# Patient Record
Sex: Female | Born: 1975 | Race: Black or African American | Hispanic: No | Marital: Single | Smoking: Never smoker
Health system: Southern US, Community
[De-identification: ages and names within clinical notes are randomized; demographics above are authoritative.]

---

## 2000-09-04 ENCOUNTER — Encounter: Payer: Self-pay | Admitting: Family Medicine

## 2000-09-04 ENCOUNTER — Ambulatory Visit (HOSPITAL_COMMUNITY): Admission: RE | Admit: 2000-09-04 | Discharge: 2000-09-04 | Payer: Self-pay | Admitting: Family Medicine

## 2001-01-09 ENCOUNTER — Other Ambulatory Visit: Admission: RE | Admit: 2001-01-09 | Discharge: 2001-01-09 | Payer: Self-pay | Admitting: Family Medicine

## 2002-09-16 ENCOUNTER — Other Ambulatory Visit: Admission: RE | Admit: 2002-09-16 | Discharge: 2002-09-16 | Payer: Self-pay | Admitting: Family Medicine

## 2004-03-02 ENCOUNTER — Emergency Department (HOSPITAL_COMMUNITY): Admission: EM | Admit: 2004-03-02 | Discharge: 2004-03-03 | Payer: Self-pay | Admitting: Emergency Medicine

## 2004-05-05 ENCOUNTER — Emergency Department (HOSPITAL_COMMUNITY): Admission: EM | Admit: 2004-05-05 | Discharge: 2004-05-05 | Payer: Self-pay | Admitting: Emergency Medicine

## 2004-09-30 ENCOUNTER — Emergency Department (HOSPITAL_COMMUNITY): Admission: EM | Admit: 2004-09-30 | Discharge: 2004-09-30 | Payer: Self-pay | Admitting: Emergency Medicine

## 2007-12-03 ENCOUNTER — Emergency Department (HOSPITAL_COMMUNITY): Admission: EM | Admit: 2007-12-03 | Discharge: 2007-12-03 | Payer: Self-pay | Admitting: Emergency Medicine

## 2008-01-15 ENCOUNTER — Ambulatory Visit (HOSPITAL_COMMUNITY): Admission: RE | Admit: 2008-01-15 | Discharge: 2008-01-15 | Payer: Self-pay | Admitting: Pulmonary Disease

## 2009-02-18 ENCOUNTER — Ambulatory Visit (HOSPITAL_BASED_OUTPATIENT_CLINIC_OR_DEPARTMENT_OTHER): Admission: RE | Admit: 2009-02-18 | Discharge: 2009-02-18 | Payer: Self-pay | Admitting: General Surgery

## 2009-02-18 ENCOUNTER — Encounter (INDEPENDENT_AMBULATORY_CARE_PROVIDER_SITE_OTHER): Payer: Self-pay | Admitting: General Surgery

## 2009-06-27 IMAGING — CR DG ANKLE COMPLETE 3+V*L*
2 series · 2 of 2 positions shown · non-contrast
Comparison: none

CLINICAL DATA: Lateral ankle pain.  Injury.
 LEFT ANKLE ? 3 VIEW:

[view not recorded (1 of 2)]
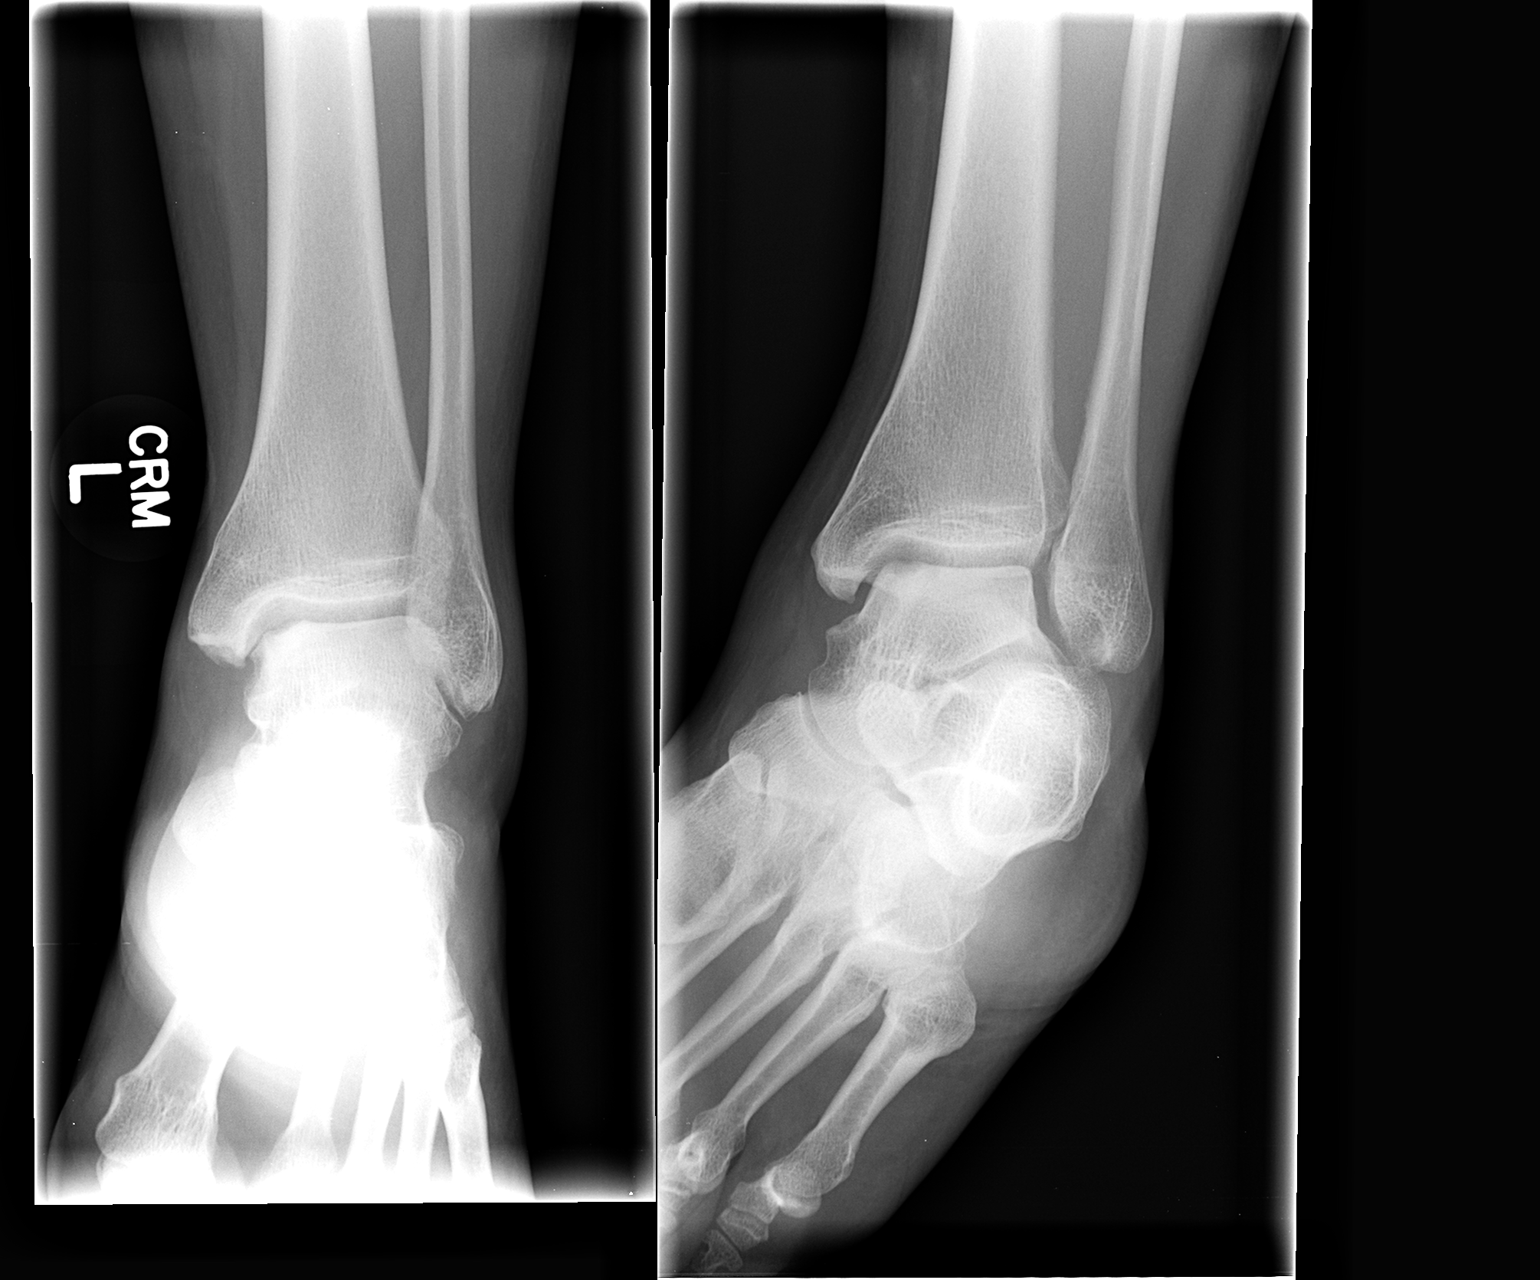

[view not recorded (2 of 2)]
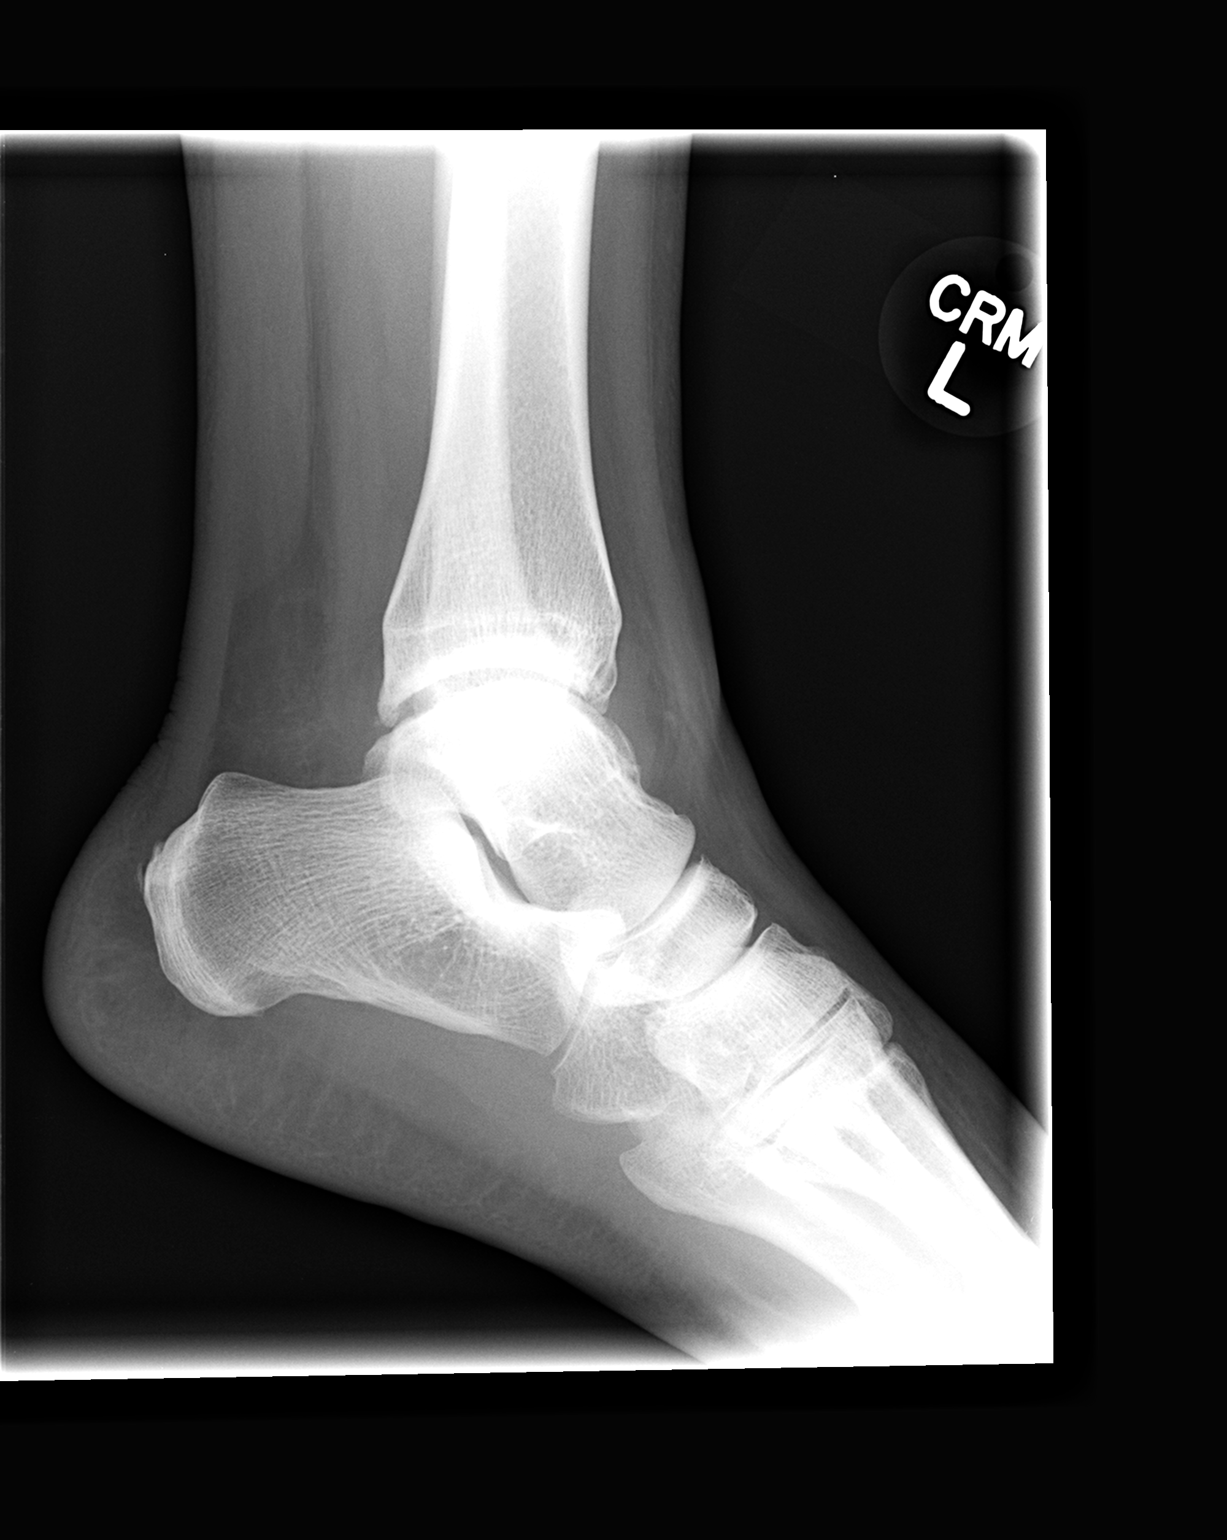

[2 of 2 positions shown; findings below may reference images not displayed]

FINDINGS: Patient has a tibiotalar joint effusion. There is a prominent lucency in the medial talar dome with cortical irregularity consistent with an osteochondral lesion.  Imaged bones are otherwise unremarkable.
IMPRESSION: 1.  Osteochondral lesion medial talar bone.
 2.  Tibiotalar joint effusion.

## 2009-08-09 IMAGING — CR DG FOOT COMPLETE 3+V*L*
3 series · 3 of 3 positions shown · non-contrast
Comparison: none

CLINICAL DATA: 32-year-old, injured foot and ankle. 
 LEFT ANKLE - 3 VIEW:

[t foot ap left]
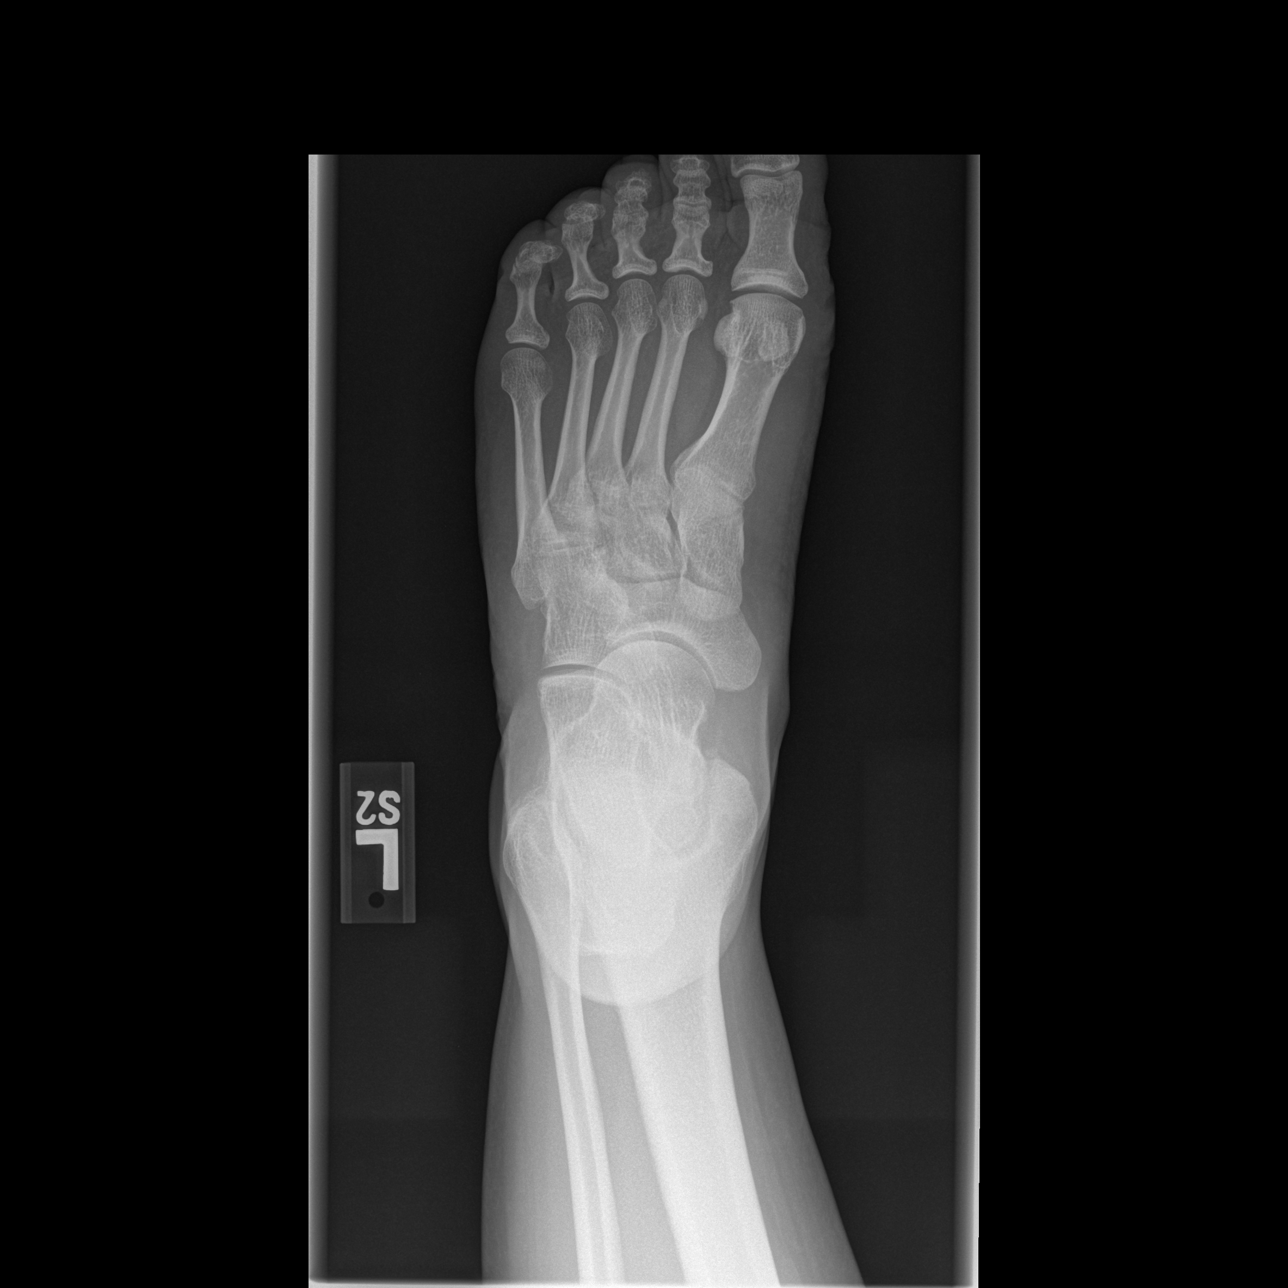

[t foot oblique left]
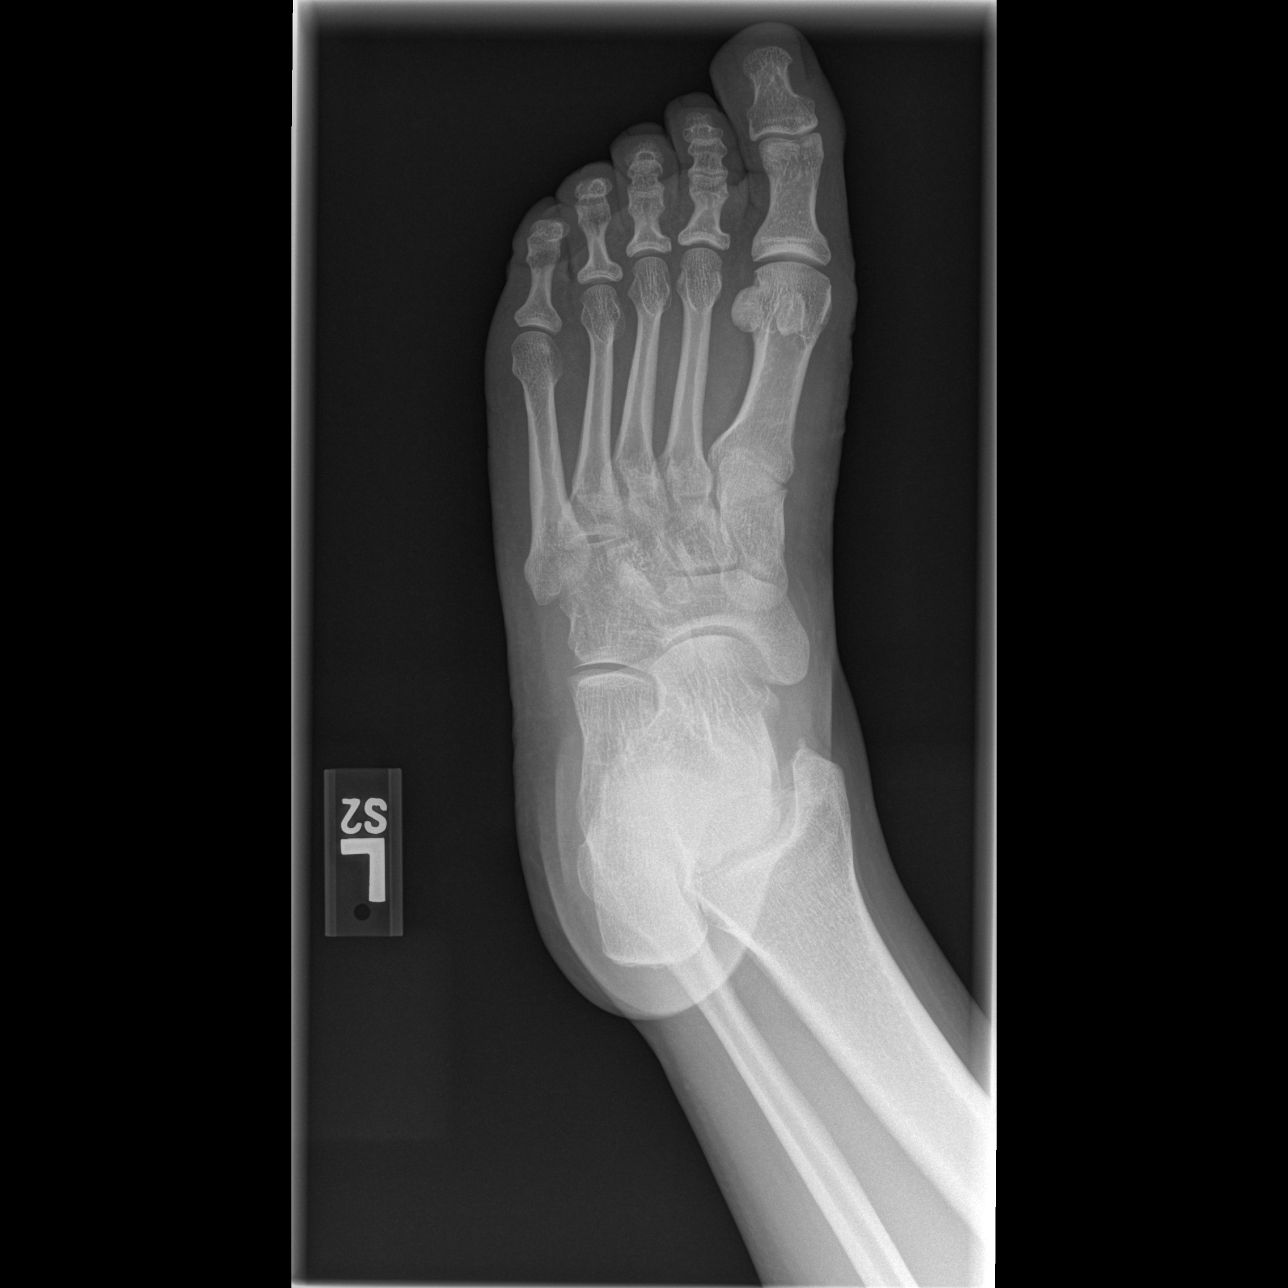

[t foot lat left]
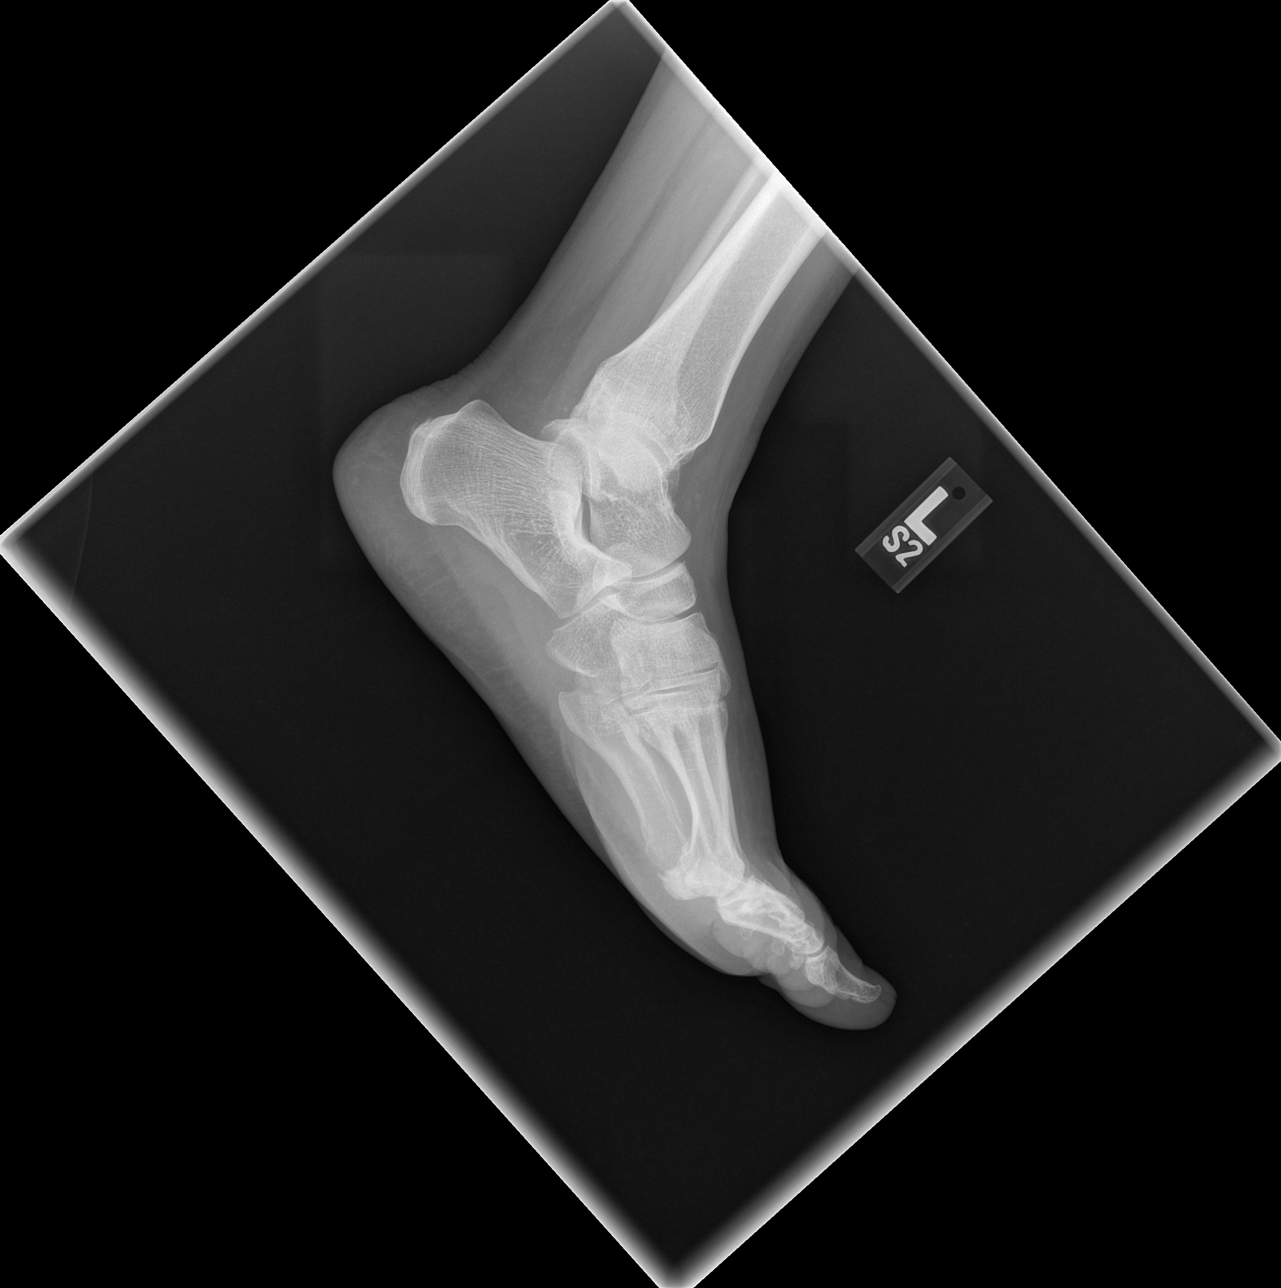

[3 of 3 positions shown; findings below may reference images not displayed]

FINDINGS: The ankle mortise is maintained.  No fractures are seen.  There is an osteochondral lesion involving the medial talar dome. MR Adonay be helpful for further evaluation of this finding.
IMPRESSION: 1.  No acute fracture. 
 2.  Osteochondral lesion involving the medial talar dome.  MR suggested for a better evaluation. 
 LEFT FOOT - 3 VIEW:
FINDINGS: Joint spaces are maintained.  No fractures are seen.
IMPRESSION: No acute bony findings.

## 2011-03-22 LAB — POCT HEMOGLOBIN-HEMACUE: Hemoglobin: 12.6 g/dL (ref 12.0–15.0)

## 2011-04-24 NOTE — Op Note (Signed)
NAMELUJEAN, EBRIGHT                  ACCOUNT NO.:  [id number]   MEDICAL RECORD NO.:  [id number]          PATIENT TYPE:  AMB   LOCATION:  DSC                          FACILITY:  [facility]   PHYSICIAN:  Cherylynn Ridges, M.D.    DATE OF BIRTH:  May 20, 1976   DATE OF PROCEDURE:  02/18/2009  DATE OF DISCHARGE:                               OPERATIVE REPORT   PREOPERATIVE DIAGNOSIS:  Lipoma of the right shoulder.   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   PROCEDURE:  Excision of right shoulder lipoma.   SURGEON:  Cherylynn Ridges, MD   ANESTHESIA:  Monitored anesthesia care with 9 mL of Marcaine and  lidocaine mixture.   COMPLICATIONS:  None.   CONDITION:  Stable.   FINDINGS:  A 4 x 5 cm subfascial lipoma in the right shoulder into the  deltoid muscle.   INDICATIONS FOR OPERATION:  The patient is a 35 year old with a growing  and more symptomatic right shoulder lipoma who comes in for excision.   OPERATION:  The patient was taken to the operating room, placed on table  in supine position.  After an adequate amount of IV sedation was given,  she was prepped and draped in usual sterile manner exposing the anterior  aspect of the right shoulder.   We marked the area of least tension on the suture line as we made our  incision along the skin lines.  We anesthetized the area with the  mixture of lidocaine and Marcaine.  Epinephrine was included.  We  injected approximately 9 mL into the area across the incision line.  Then we used a 15 blade to make the incision down into the subcutaneous  tissue.  Through the initial layer of the subcu, we got down to a smooth  capsular area of the lipoma, which we had it circumferentially dissected  out from underneath the fascia and the shoulder with interdigitating  between the fibers of the deltoid muscle.  We were able to get down to  its base and remove it in total without leaving any components inside.  Once this was done,  we irrigated with saline, then we closed in 2  layers, deep 3-0 Vicryl layer, then the skin was closed using running  subcuticular stitch of 4-0 Monocryl.  No additional anesthetic was  given.  It was closed with Dermabond, Steri-Strips, and a Tegaderm.  All  counts were correct.     Cherylynn Ridges, M.D.  Electronically Signed    JOW/MEDQ  D:  02/18/2009  T:  02/18/2009  Job:  [id number]

## 2011-09-14 LAB — URIC ACID: Uric Acid, Serum: 3.9

## 2014-01-08 ENCOUNTER — Emergency Department (INDEPENDENT_AMBULATORY_CARE_PROVIDER_SITE_OTHER)
Admission: EM | Admit: 2014-01-08 | Discharge: 2014-01-08 | Disposition: A | Discharge status: Home / Self Care | Payer: Self-pay | Source: Home / Self Care | Attending: Family Medicine | Admitting: Family Medicine

## 2014-01-08 ENCOUNTER — Encounter (HOSPITAL_COMMUNITY): Payer: Self-pay | Admitting: Emergency Medicine

## 2014-01-08 DIAGNOSIS — G510 Bell's palsy: Secondary | ICD-10-CM

## 2014-01-08 MED ORDER — ACYCLOVIR 400 MG PO TABS: 800.0000 mg | ORAL_TABLET | Freq: Three times a day (TID) | ORAL | Status: AC

## 2014-01-08 NOTE — ED Notes (Signed)
Patient c/o 2 week duration of facial numbness, right sided facial weakness . Facial sagging w smile , face issues only. Denies other issues

## 2014-01-08 NOTE — ED Provider Notes (Signed)
CSN: [id number]     Arrival date & time 01/08/14  1953 History   First MD Initiated Contact with Patient 01/08/14 2040     Chief Complaint  Patient presents with  . Numbness   (Consider location/radiation/quality/duration/timing/severity/associated sxs/prior Treatment) HPI  38 year old f with facial droop. It is right sided. 2 weeks duration. First noticed that her smile was assymmettic when taking a photo. Also, her right eye has been watering more than usual. No difficulty swallowing or speaking. No pain. No hx of similar problems. She was told it may be Bells palsy. She denies numbness or weakness of her extermities. No recent illnesses.   History reviewed. No pertinent past medical history. History reviewed. No pertinent past surgical history. History reviewed. No pertinent family history. History  Substance Use Topics  . Smoking status: Never Smoker   . Smokeless tobacco: Not on file  . Alcohol Use: No   OB History   Grav Para Term Preterm Abortions TAB SAB Ect Mult Living                 Review of Systems See HPI Allergies  Shellfish allergy  Home Medications   Current Outpatient Rx  Name  Route  Sig  Dispense  Refill  . acyclovir (ZOVIRAX) 400 MG tablet   Oral   Take 2 tablets (800 mg total) by mouth 3 (three) times daily.   42 tablet   0    BP 128/63  Pulse 72  Temp(Src) 98.4 F (36.9 C) (Oral)  Resp 14  SpO2 100%  LMP 12/15/2013 Physical Exam  Constitutional: She is oriented to person, place, and time. She appears well-developed and well-nourished. No distress.  HENT:  Head: Normocephalic.  Mouth/Throat: Oropharynx is clear and moist.  Eyes: Conjunctivae are normal. Pupils are equal, round, and reactive to light.  Cardiovascular: Normal rate, regular rhythm and normal heart sounds.   Pulmonary/Chest: Effort normal and breath sounds normal.  Neurological: She is alert and oriented to person, place, and time. She has normal reflexes. She displays normal  reflexes. A cranial nerve deficit is present. No sensory deficit. She exhibits normal muscle tone. Coordination normal.  Right sided facial nerve palsy involving the cheek predominantly and mouth with mild eye and forehead symptoms     ED Course  Procedures (including critical care time) Labs Review Labs Reviewed - No data to display Imaging Review No results found.    MDM   1. Bell's palsy    Mild lesion and already showing improvement which is a good prognosticator. Given acyclovir to take for one week to see if this aids in the recovery process.     Garnetta BuddyEdward V Derry Kassel, MD 01/08/14 2200

## 2014-01-08 NOTE — Discharge Instructions (Signed)
Hannah Dixon,   You seem to have a case of Bell's palsy, which is paralysis of the facial nerve. Thankfully you have shown improvement already. This is a good sign. Please try the acyclovir , 2 tablets 3 times a day, for one week. It should continue to get better. Please let us know if things worsen.  Sincerely,   Dr. Vena RuaWilliamson   Bell's Palsy Bell's palsy is a condition in which the muscles on one side of the face cannot move (paralysis). This is because the nerves in the face are paralyzed. It is most often thought to be caused by a virus. The virus causes swelling of the nerve that controls movement on one side of the face. The nerve travels through a tight space surrounded by bone. When the nerve swells, it can be compressed by the bone. This results in damage to the protective covering around the nerve. This damage interferes with how the nerve communicates with the muscles of the face. As a result, it can cause weakness or paralysis of the facial muscles.  Injury (trauma), tumor, and surgery may cause Bell's palsy, but most of the time the cause is unknown. It is a relatively common condition. It starts suddenly (abrupt onset) with the paralysis usually ending within 2 days. Bell's palsy is not dangerous. But because the eye does not close properly, you may need care to keep the eye from getting dry. This can include splinting (to keep the eye shut) or moistening with artificial tears. Bell's palsy very seldom occurs on both sides of the face at the same time. SYMPTOMS   Eyebrow sagging.  Drooping of the eyelid and corner of the mouth.  Inability to close one eye.  Loss of taste on the front of the tongue.  Sensitivity to loud noises. TREATMENT  The treatment is usually non-surgical. If the patient is seen within the first 24 to 48 hours, a short course of steroids may be prescribed, in an attempt to shorten the length of the condition. Antiviral medicines may also be used with the  steroids, but it is unclear if they are helpful.  You will need to protect your eye, if you cannot close it. The cornea (clear covering over your eye) will become dry and can be damaged. Artificial tears can be used to keep your eye moist. Glasses or an eye patch should be worn to protect your eye. PROGNOSIS  Recovery is variable, ranging from days to months. Although the problem usually goes away completely (about 80% of cases resolve), predicting the outcome is impossible. Most people improve within 3 weeks of when the symptoms began. Improvement may continue for 3 to 6 months. A small number of people have moderate to severe weakness that is permanent.  HOME CARE INSTRUCTIONS   If your caregiver prescribed medication to reduce swelling in the nerve, use as directed. Do not stop taking the medication unless directed by your caregiver.  Use moisturizing eye drops as needed to prevent drying of your eye, as directed by your caregiver.  Protect your eye, as directed by your caregiver.  Use facial massage and exercises, as directed by your caregiver.  Perform your normal activities, and get your normal rest. SEEK IMMEDIATE MEDICAL CARE IF:   There is pain, redness or irritation in the eye.  You or your child has an oral temperature above 102 F (38.9 C), not controlled by medicine. MAKE SURE YOU:   Understand these instructions.  Will watch your condition.  Will  get help right away if you are not doing well or get worse. Document Released: 11/26/2005 Document Revised: 02/18/2012 Document Reviewed: 12/05/2009 [facility] Patient Information 2014 [place], [place].

## 2014-01-10 NOTE — ED Provider Notes (Signed)
Medical screening examination/treatment/procedure(s) were performed by resident physician or non-physician practitioner and as supervising physician I was immediately available for consultation/collaboration.   Barkley BrunsKINDL,Bradlee Bridgers DOUGLAS MD.   Linna HoffJames D Maysie Parkhill, MD 01/10/14 1229

## 2023-11-14 ENCOUNTER — Emergency Department (HOSPITAL_COMMUNITY)
Admission: EM | Admit: 2023-11-14 | Discharge: 2023-11-14 | Discharge status: Against Medical Advice | Payer: Commercial Managed Care - PPO | Attending: Emergency Medicine | Admitting: Emergency Medicine

## 2023-11-14 ENCOUNTER — Emergency Department (HOSPITAL_COMMUNITY): Payer: Commercial Managed Care - PPO

## 2023-11-14 DIAGNOSIS — M545 Low back pain, unspecified: Secondary | ICD-10-CM | POA: Diagnosis present

## 2023-11-14 DIAGNOSIS — Y92410 Unspecified street and highway as the place of occurrence of the external cause: Secondary | ICD-10-CM | POA: Diagnosis not present

## 2023-11-14 DIAGNOSIS — M79662 Pain in left lower leg: Secondary | ICD-10-CM | POA: Insufficient documentation

## 2023-11-14 DIAGNOSIS — Z5321 Procedure and treatment not carried out due to patient leaving prior to being seen by health care provider: Secondary | ICD-10-CM | POA: Diagnosis not present

## 2023-11-14 MED ORDER — OXYCODONE HCL 5 MG PO TABS
5.0000 mg | ORAL_TABLET | ORAL | Status: AC
  Administered 2023-11-14: 5 mg via ORAL
  Filled 2023-11-14: qty 1

## 2023-11-14 MED ORDER — IBUPROFEN 400 MG PO TABS
600.0000 mg | ORAL_TABLET | Freq: Once | ORAL | Status: AC
  Administered 2023-11-14: 600 mg via ORAL
  Filled 2023-11-14: qty 1

## 2023-11-14 NOTE — ED Notes (Signed)
Patient left.
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
